# Patient Record
Sex: Female | Born: 1990 | Race: Asian | Hispanic: No | Marital: Single | Smoking: Never smoker
Health system: Southern US, Community
[De-identification: ages and names within clinical notes are randomized; demographics above are authoritative.]

---

## 2015-02-28 ENCOUNTER — Emergency Department (INDEPENDENT_AMBULATORY_CARE_PROVIDER_SITE_OTHER)
Admission: EM | Admit: 2015-02-28 | Discharge: 2015-02-28 | Disposition: A | Discharge status: Home / Self Care | Payer: BLUE CROSS/BLUE SHIELD | Source: Home / Self Care | Attending: Emergency Medicine | Admitting: Emergency Medicine

## 2015-02-28 ENCOUNTER — Encounter (HOSPITAL_COMMUNITY): Payer: Self-pay | Admitting: Emergency Medicine

## 2015-02-28 DIAGNOSIS — Z711 Person with feared health complaint in whom no diagnosis is made: Secondary | ICD-10-CM | POA: Diagnosis not present

## 2015-02-28 NOTE — ED Notes (Signed)
Pt states that she believes she left her tampon in her vagina from last night.

## 2015-02-28 NOTE — ED Provider Notes (Signed)
CSN: [id number]     Arrival date & time 02/28/15  1554 History   First MD Initiated Contact with Patient 02/28/15 1737     No chief complaint on file.  (Consider location/radiation/quality/duration/timing/severity/associated sxs/prior Treatment) HPI She is a 24 year old woman here for vaginal concern. She states she woke up this morning and didn't see the strings for her tampon. She put in another camp on and when she removed it later today there is not as much blood as she was expecting. She is concerned that there is a tampon in her vagina. She denies any pain or discomfort.  No past medical history on file. No past surgical history on file. No family history on file. History  Substance Use Topics  . Smoking status: Not on file  . Smokeless tobacco: Not on file  . Alcohol Use: Not on file   OB History    No data available     Review of Systems As in history of present illness Allergies  Review of patient's allergies indicates not on file.  Home Medications   Prior to Admission medications   Not on File   BP 102/60 mmHg  Pulse 60  Temp(Src) 98.4 F (36.9 C) (Oral)  Resp 16  SpO2 100% Physical Exam  Constitutional: She is oriented to person, place, and time. She appears well-developed and well-nourished. No distress.  Cardiovascular: Normal rate.   Pulmonary/Chest: Effort normal.  Genitourinary: There is no rash on the right labia. There is no rash on the left labia. There is bleeding (on period) in the vagina. No foreign body around the vagina. No signs of injury around the vagina. No vaginal discharge found.  Neurological: She is alert and oriented to person, place, and time.    ED Course  Procedures (including critical care time) Labs Review Labs Reviewed - No data to display  Imaging Review No results found.   MDM   1. Worried well    No foreign body in the vagina. Follow-up as needed.    Melony Overly, MD 02/28/15 [phone]

## 2015-02-28 NOTE — Discharge Instructions (Signed)
There was no foreign body in your vagina.

## 2019-07-27 ENCOUNTER — Other Ambulatory Visit: Payer: Self-pay

## 2019-07-27 DIAGNOSIS — Z20822 Contact with and (suspected) exposure to covid-19: Secondary | ICD-10-CM

## 2019-07-28 LAB — NOVEL CORONAVIRUS, NAA: SARS-CoV-2, NAA: NOT DETECTED

## 2019-12-14 ENCOUNTER — Other Ambulatory Visit: Payer: Self-pay

## 2021-02-11 ENCOUNTER — Emergency Department (HOSPITAL_COMMUNITY)
Admission: EM | Admit: 2021-02-11 | Discharge: 2021-02-11 | Disposition: A | Discharge status: Home / Self Care | Payer: BC Managed Care – PPO | Attending: Emergency Medicine | Admitting: Emergency Medicine

## 2021-02-11 ENCOUNTER — Emergency Department (HOSPITAL_COMMUNITY): Payer: BC Managed Care – PPO

## 2021-02-11 ENCOUNTER — Encounter (HOSPITAL_COMMUNITY): Payer: Self-pay

## 2021-02-11 ENCOUNTER — Other Ambulatory Visit: Payer: Self-pay

## 2021-02-11 DIAGNOSIS — D259 Leiomyoma of uterus, unspecified: Secondary | ICD-10-CM | POA: Insufficient documentation

## 2021-02-11 DIAGNOSIS — D7389 Other diseases of spleen: Secondary | ICD-10-CM | POA: Insufficient documentation

## 2021-02-11 DIAGNOSIS — R1031 Right lower quadrant pain: Secondary | ICD-10-CM

## 2021-02-11 DIAGNOSIS — R1011 Right upper quadrant pain: Secondary | ICD-10-CM | POA: Diagnosis present

## 2021-02-11 LAB — URINALYSIS, ROUTINE W REFLEX MICROSCOPIC
Bilirubin Urine: NEGATIVE
Glucose, UA: NEGATIVE mg/dL
Ketones, ur: NEGATIVE mg/dL
Nitrite: NEGATIVE
Protein, ur: NEGATIVE mg/dL
Specific Gravity, Urine: 1.013 (ref 1.005–1.030)
pH: 5 (ref 5.0–8.0)

## 2021-02-11 LAB — CBC WITH DIFFERENTIAL/PLATELET
Abs Immature Granulocytes: 0.05 10*3/uL (ref 0.00–0.07)
Basophils Absolute: 0 10*3/uL (ref 0.0–0.1)
Basophils Relative: 0 %
Eosinophils Absolute: 0.2 10*3/uL (ref 0.0–0.5)
Eosinophils Relative: 2 %
HCT: 42.4 % (ref 36.0–46.0)
Hemoglobin: 13.4 g/dL (ref 12.0–15.0)
Immature Granulocytes: 1 %
Lymphocytes Relative: 25 %
Lymphs Abs: 2.5 10*3/uL (ref 0.7–4.0)
MCH: 27.6 pg (ref 26.0–34.0)
MCHC: 31.6 g/dL (ref 30.0–36.0)
MCV: 87.4 fL (ref 80.0–100.0)
Monocytes Absolute: 0.6 10*3/uL (ref 0.1–1.0)
Monocytes Relative: 6 %
Neutro Abs: 6.7 10*3/uL (ref 1.7–7.7)
Neutrophils Relative %: 66 %
Platelets: 268 10*3/uL (ref 150–400)
RBC: 4.85 MIL/uL (ref 3.87–5.11)
RDW: 13.7 % (ref 11.5–15.5)
WBC: 10.1 10*3/uL (ref 4.0–10.5)
nRBC: 0 % (ref 0.0–0.2)

## 2021-02-11 LAB — COMPREHENSIVE METABOLIC PANEL
ALT: 29 U/L (ref 0–44)
AST: 21 U/L (ref 15–41)
Albumin: 3.6 g/dL (ref 3.5–5.0)
Alkaline Phosphatase: 59 U/L (ref 38–126)
Anion gap: 7 (ref 5–15)
BUN: 7 mg/dL (ref 6–20)
CO2: 24 mmol/L (ref 22–32)
Calcium: 9 mg/dL (ref 8.9–10.3)
Chloride: 107 mmol/L (ref 98–111)
Creatinine, Ser: 0.63 mg/dL (ref 0.44–1.00)
GFR, Estimated: >60 mL/min (ref >60)
Glucose, Bld: 97 mg/dL (ref 70–99)
Potassium: 4.3 mmol/L (ref 3.5–5.1)
Sodium: 138 mmol/L (ref 135–145)
Total Bilirubin: 0.5 mg/dL (ref 0.3–1.2)
Total Protein: 7.4 g/dL (ref 6.5–8.1)

## 2021-02-11 LAB — PREGNANCY, URINE: Preg Test, Ur: NEGATIVE

## 2021-02-11 MED ORDER — IOHEXOL 300 MG/ML  SOLN
100.0000 mL | Freq: Once | INTRAMUSCULAR | Status: AC | PRN
  Administered 2021-02-11: 100 mL via INTRAVENOUS

## 2021-02-11 NOTE — ED Triage Notes (Signed)
Pt presents with c/o nausea and abdominal pain. Pt reports that she went to UC and was told to come to the ER for an elevated WBC.

## 2021-02-11 NOTE — ED Triage Notes (Signed)
Emergency Medicine Provider Triage Evaluation Note  Lisa Reilly , a 30 y.o. female  was evaluated in triage.  Pt complains of right lower quadrant pain as well as nausea.  Patient states her symptoms started about 6 days ago.  They have been intermittent.  She states that she was evaluated at [facility] walk-in clinic and had a negative pregnancy test but notes that she had an elevated white blood cell count at that time and was told to come to the emergency department.  Denies any vomiting, diarrhea, dysuria, chest pain, shortness of breath, vaginal discharge.  She is married and in a monogamous relationship.  Her last menstrual cycle was on February 25 and was normal.  Review of Systems  Positive: Right lower quadrant pain, nausea Negative: Vomiting, vaginal discharge, dysuria, hematuria, chest pain, shortness of breath  Physical Exam  BP (!) 143/89 (BP Location: Right Arm)   Pulse 68   Temp 98.5 F (36.9 C) (Oral)   Resp 18   LMP 12/30/2020 (Approximate)   SpO2 100%  Gen:   Awake, no distress   HEENT:  Atraumatic  Resp:  Normal effort  Cardiac:  Normal rate  Abd:   Nondistended, mild tenderness noted in the right lower quadrant. MSK:   Moves extremities without difficulty  Neuro:  Speech clear   Medical Decision Making  Medically screening exam initiated at 10:24 AM.  Appropriate orders placed.  Lisa Reilly was informed that the remainder of the evaluation will be completed by another provider, this initial triage assessment does not replace that evaluation, and the importance of remaining in the ED until their evaluation is complete.    Lisa Sexton, PA-C 02/11/21 1025

## 2021-02-11 NOTE — ED Provider Notes (Signed)
[place] DEPT Provider Note   CSN: [id number] Arrival date & time: 02/11/21  1000     History Chief Complaint  Patient presents with  . Nausea  . Abdominal Pain    Lisa Reilly is a 30 y.o. female presents to the ED for evaluation of abdominal pain.  This began 1 week ago.  Reports "mild" pain in the right lower quadrant.  Nonradiating.  It is worse when she walks sometimes.  States it has not been bad enough to take any medicines.  She went to [facility] clinic yesterday and was told that her WBC was high at 13.7.  She was called this morning and told to come to the ER.  Currently has no pain.  She feels achy and tired all over.  She is sexually active with her husband and is not concerned about any STDs.  No history of abdominal surgeries.  No history of kidney stones.  Denies fevers, chills, vomiting.  No dysuria, hematuria.  Reports frequent urination that she attributes to drinking a lot of water during the day.  No abnormal vaginal discharge.  No known ovarian cysts, fibroids.  Her last cycle was Feb 25-March 3rd.   HPI     History reviewed. No pertinent past medical history.  There are no problems to display for this patient.   History reviewed. No pertinent surgical history.   OB History   No obstetric history on file.     History reviewed. No pertinent family history.  Social History   Tobacco Use  . Smoking status: Never Smoker  Substance Use Topics  . Alcohol use: No  . Drug use: No    Home Medications Prior to Admission medications   Not on File    Allergies    Patient has no known allergies.  Review of Systems   Review of Systems  Gastrointestinal: Positive for abdominal pain and nausea.  All other systems reviewed and are negative.   Physical Exam Updated Vital Signs BP 103/66   Pulse (!) 59   Temp 98.5 F (36.9 C) (Oral)   Resp 18   LMP 12/30/2020 (Approximate)   SpO2 98%   Physical Exam Vitals and  nursing note reviewed.  Constitutional:      Appearance: She is well-developed.     Comments: Non toxic in NAD  HENT:     Head: Normocephalic and atraumatic.     Nose: Nose normal.  Eyes:     Conjunctiva/sclera: Conjunctivae normal.  Cardiovascular:     Rate and Rhythm: Normal rate and regular rhythm.  Pulmonary:     Effort: Pulmonary effort is normal.     Breath sounds: Normal breath sounds.  Abdominal:     General: Bowel sounds are normal.     Palpations: Abdomen is soft.     Tenderness: There is abdominal tenderness in the right lower quadrant.     Comments: No G/R/R. No suprapubic or CVA tenderness. Negative Murphy's. Active BS to lower quadrants.   Musculoskeletal:        General: Normal range of motion.     Cervical back: Normal range of motion.  Skin:    General: Skin is warm and dry.     Capillary Refill: Capillary refill takes less than 2 seconds.  Neurological:     Mental Status: She is alert.  Psychiatric:        Behavior: Behavior normal.     ED Results / Procedures / Treatments   Labs (  all labs ordered are listed, but only abnormal results are displayed) Labs Reviewed  URINALYSIS, ROUTINE W REFLEX MICROSCOPIC - Abnormal; Notable for the following components:      Result Value   APPearance HAZY (*)    Hgb urine dipstick MODERATE (*)    Leukocytes,Ua SMALL (*)    Bacteria, UA RARE (*)    All other components within normal limits  URINE CULTURE  COMPREHENSIVE METABOLIC PANEL  CBC WITH DIFFERENTIAL/PLATELET  PREGNANCY, URINE    EKG None  Radiology CT ABDOMEN PELVIS W CONTRAST  Result Date: 02/11/2021 CLINICAL DATA:  Right lower quadrant abdominal pain. Nausea and vomiting. EXAM: CT ABDOMEN AND PELVIS WITH CONTRAST TECHNIQUE: Multidetector CT imaging of the abdomen and pelvis was performed using the standard protocol following bolus administration of intravenous contrast. CONTRAST:  154mL OMNIPAQUE IOHEXOL 300 MG/ML  SOLN COMPARISON:  None. FINDINGS:  Lower chest: Unremarkable. Hepatobiliary: No suspicious focal abnormality within the liver parenchyma. There is no evidence for gallstones, gallbladder wall thickening, or pericholecystic fluid. No intrahepatic or extrahepatic biliary dilation. Pancreas: No focal mass lesion. No dilatation of the main duct. No intraparenchymal cyst. No peripancreatic edema. Spleen: 5.8 x 4.6 cm splenic lesion cannot be definitively characterized. Adrenals/Urinary Tract: No adrenal nodule or mass. Early contrast excretion noted in calices of both kidneys. Kidneys otherwise unremarkable. No evidence for hydroureter. The urinary bladder appears normal for the degree of distention. Stomach/Bowel: Stomach is unremarkable. No gastric wall thickening. No evidence of outlet obstruction. Duodenum is normally positioned as is the ligament of Treitz. No small bowel wall thickening. No small bowel dilatation. The terminal ileum is normal. The appendix is normal. No gross colonic mass. No colonic wall thickening. Vascular/Lymphatic: No abdominal aortic aneurysm. There is no gastrohepatic or hepatoduodenal ligament lymphadenopathy. No retroperitoneal or mesenteric lymphadenopathy. No pelvic sidewall lymphadenopathy. Reproductive: Small uterine fibroids evident. There is no adnexal mass. Other: No intraperitoneal free fluid. Musculoskeletal: No worrisome lytic or sclerotic osseous abnormality. IMPRESSION: 1. No acute findings in the abdomen or pelvis. Specifically, no findings to explain the patient's history of right lower quadrant pain with nausea and vomiting. Terminal ileum and appendix unremarkable. No adnexal mass. 2. 5.8 x 4.6 cm splenic lesion cannot be definitively characterized. Given patient age, this is most likely benign and differential considerations would include hemangioma and hamartoma. MRI of the abdomen without and with might prove helpful to further evaluate. 3. Small uterine fibroids. Electronically Signed   By: Misty Stanley  M.D.   On: 02/11/2021 14:37    Procedures Procedures   Medications Ordered in ED Medications  iohexol (OMNIPAQUE) 300 MG/ML solution 100 mL (100 mLs Intravenous Contrast Given 02/11/21 1355)    ED Course  I have reviewed the triage vital signs and the nursing notes.  Pertinent labs & imaging results that were available during my care of the patient were reviewed by me and considered in my medical decision making (see chart for details).    MDM Rules/Calculators/A&P                          30 year old female presents to the ED for very mild right lower quadrant abdominal pain with nausea.  Seen by [facility] walk-in clinic yesterday and advised to come to the ER.  Was told her WBC was 13 yesterday.  On exam she has very minimal right lower quadrant tenderness.  No peritonitis.  No CVA tenderness.  Denies vaginal or urinary symptoms.  EMR, triage nurse  notes reviewed  DDx-MSK, right-sided diverticulitis, appendicitis, ureteral stone.  Considered pelvic/GYN etiology however patient has no vaginal symptoms, vaginal discharge or bleeding, history of ovarian cyst, fibroids.  She describes her pain is very mild.  Labs, imaging ordered by me as above.  Labs and imaging personally visualized and interpreted  Labs reveal-WBC is 10.1 today.  Remaining labs are unremarkable.  Pregnancy is negative.  Urinalysis is a poor sample with several squamous epithelial, rare bacteria, 6-10 WBC, small leukocytes but no nitrites, RBCs.  Offered patient pelvic exam for thoroughness however she has no vaginal symptoms or urinary symptoms.  She would prefer to defer this at this time and I think this is reasonable.  We will proceed with CT A/P.  1848: CT A/P shows incidental finding of splenic lesion, small uterine fibroid but otherwise unremarkable.  At this time feel patient is appropriate for discharge.  Etiology of pain is unclear at this time, could be MSK etiology.  I recommended scheduled ibuprofen every 8  hours for the next 2 days for pain, monitoring of her symptoms.  Return precautions discussed.  Patient is comfortable with this plan.  She is aware of incidental findings on the CT scan that she can follow-up with with PCP.  Final Clinical Impression(s) / ED Diagnoses Final diagnoses:  RLQ abdominal pain  Lesion of spleen  Uterine leiomyoma, unspecified location    Rx / DC Orders ED Discharge Orders    None       Arlean Hopping 02/11/21 1530    Davonna Belling, MD 02/12/21 [phone]

## 2021-02-11 NOTE — Discharge Instructions (Addendum)
You were seen in the emergency department for right lower abdominal pain, nausea  Lab work was normal.  Urinalysis was not a very good sample but there was no blood or obvious infection.  We will send this for culture to exclude UTI but you did not have any symptoms of this.  If your urine grows any bacteria you will be contacted and prescription for antibiotics will be sent to your pharmacy.  CT scan showed incidental finding of a small lesion on your spleen, this is likely benign.  It also noticed a very small fibroid in your uterus.  These 2 things are unlikely to be causing your pain.  You may discuss these findings with your primary care doctor.  Take 600 mg of ibuprofen every 8 hours for the next 48 hours to help with your pain.  You can then take it as needed.  Follow-up with your primary care doctor in 1 week if your symptoms have not improved.  Return to the ED for worsening, more severe symptoms, fever, vomiting, urinary symptoms, abnormal vaginal discharge or bleeding

## 2021-02-12 LAB — URINE CULTURE: Culture: NO GROWTH

## 2022-09-05 DIAGNOSIS — E039 Hypothyroidism, unspecified: Secondary | ICD-10-CM | POA: Insufficient documentation

## 2022-11-28 IMAGING — CT CT ABD-PELV W/ CM
2 of 5 series · 16 of 46 positions shown, 18 images · IV contrast (omnipaque)
Comparison: None.

CLINICAL DATA: Right lower quadrant abdominal pain. Nausea and
vomiting.

EXAM:
CT ABDOMEN AND PELVIS WITH CONTRAST
TECHNIQUE: Multidetector CT imaging of the abdomen and pelvis was performed
using the standard protocol following bolus administration of
intravenous contrast.
CONTRAST:  100mL OMNIPAQUE IOHEXOL 300 MG/ML  SOLN

[Series 2: axial st · axial · 0.73mm/px · z∈[+1246,+1681]mm · 13 of 101 slices shown, 15 images]
[im 7/101  soft-tissue]
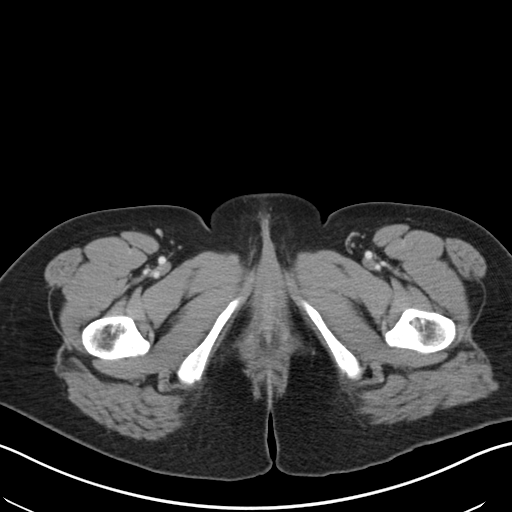
[im 7/101  bone]
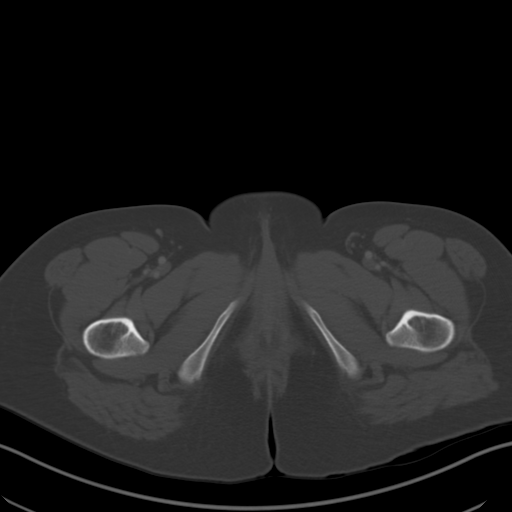
[im 14/101  soft-tissue]
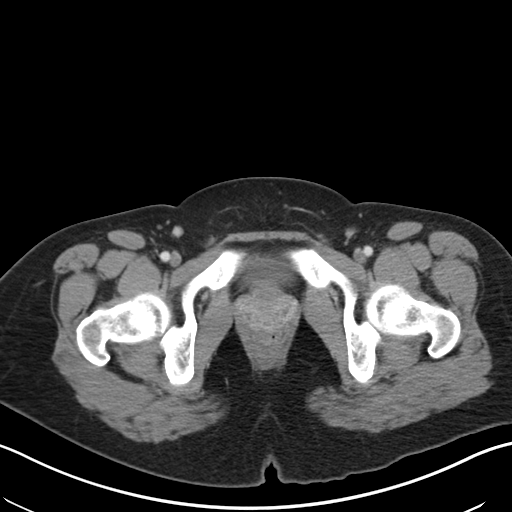
[im 21/101  soft-tissue]
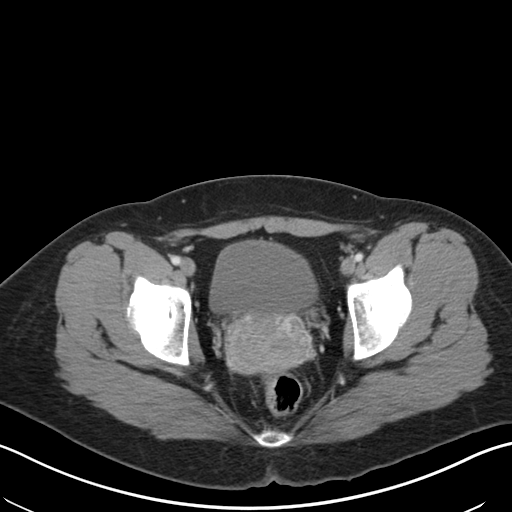
[im 27/101  soft-tissue]
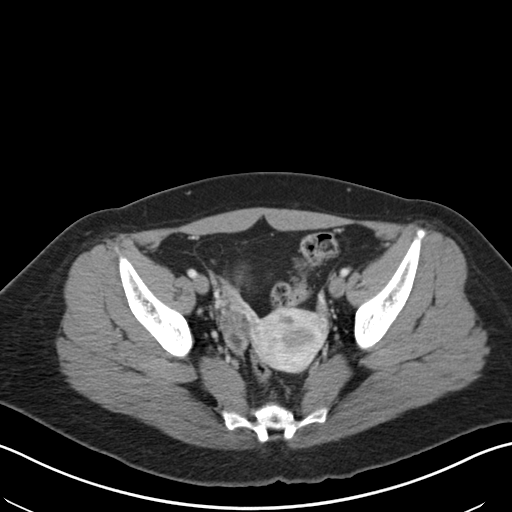
[im 34/101  soft-tissue]
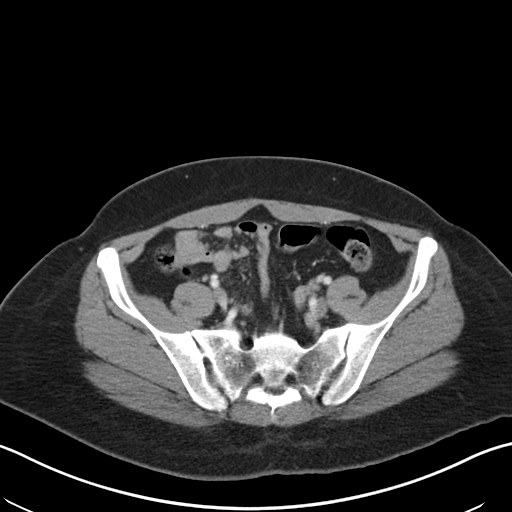
[im 41/101  soft-tissue]
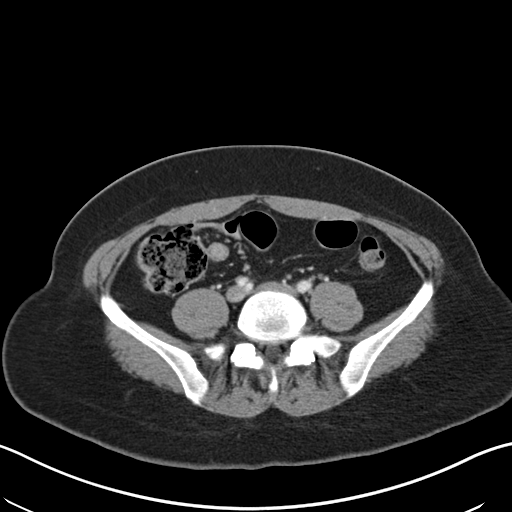
[im 54/101  soft-tissue]
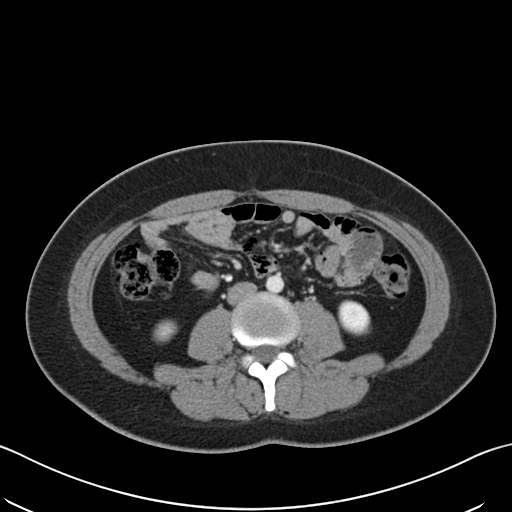
[im 61/101  soft-tissue]
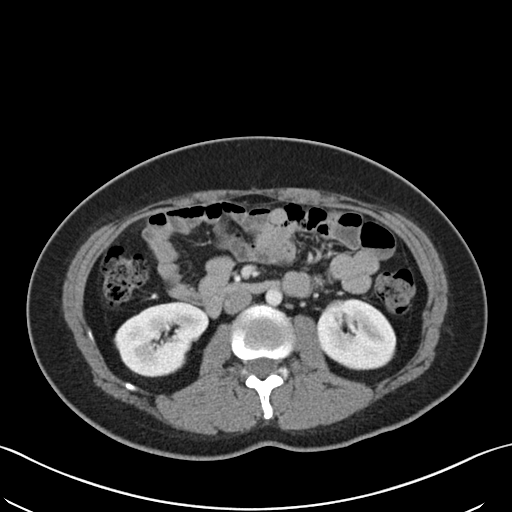
[im 67/101  soft-tissue]
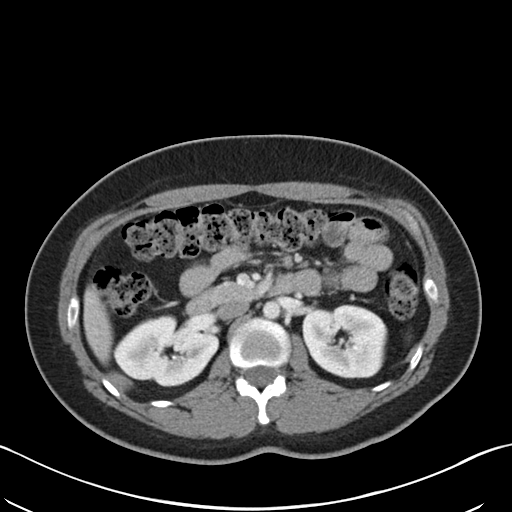
[im 67/101  bone]
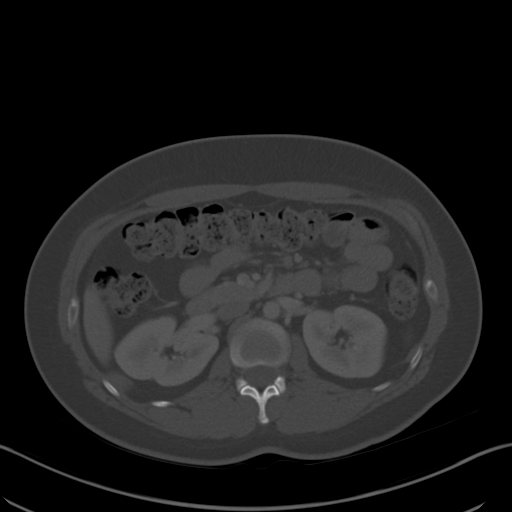
[im 74/101  soft-tissue]
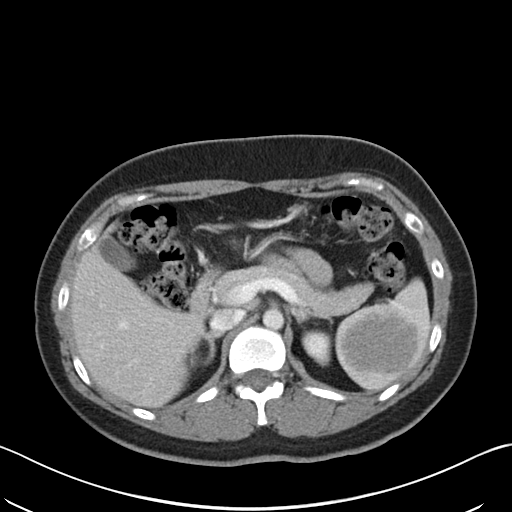
[im 81/101  soft-tissue]
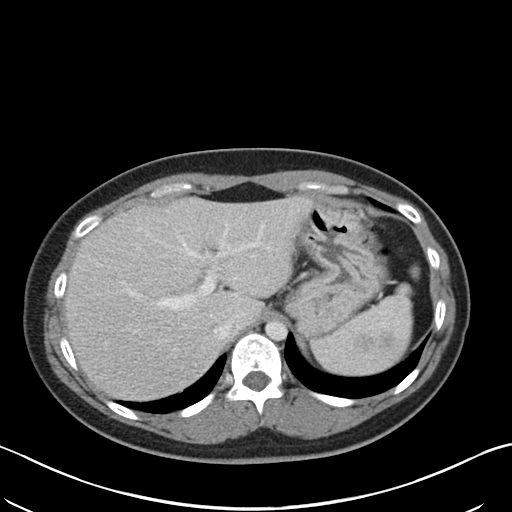
[im 87/101  soft-tissue]
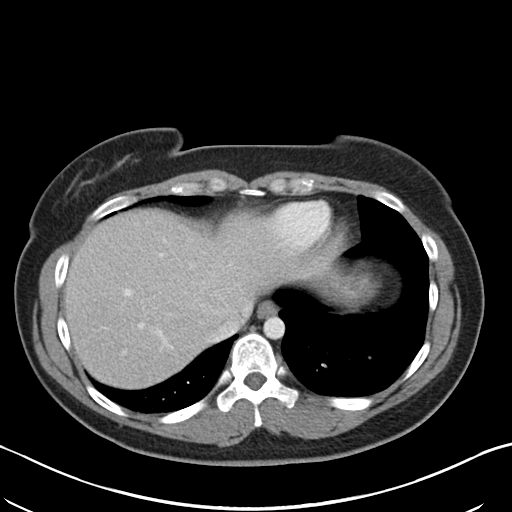
[im 94/101  soft-tissue]
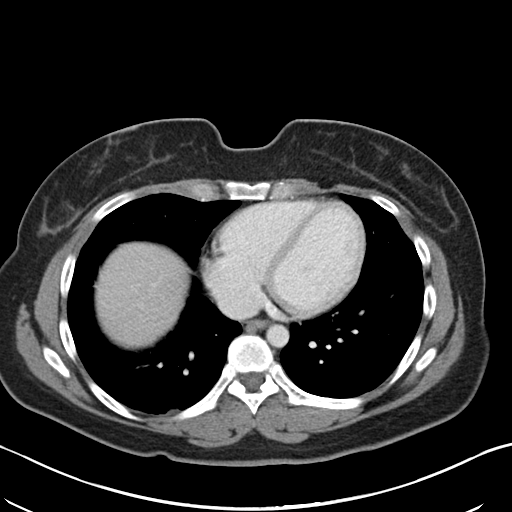

[Series 6: coronal st · coronal · 0.96mm/px · 3 of 127 slices shown]
[im 43/127  soft-tissue]
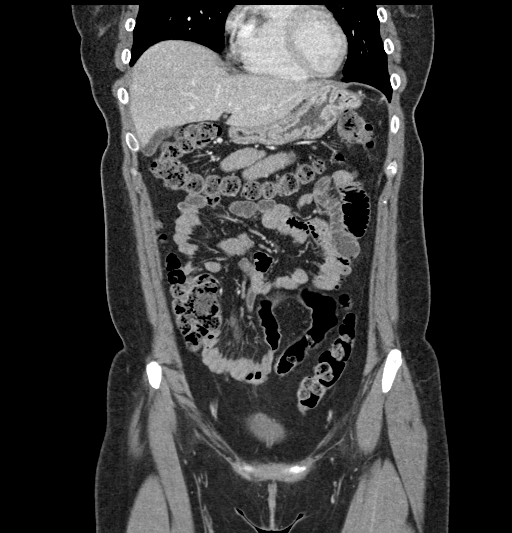
[im 57/127  soft-tissue]
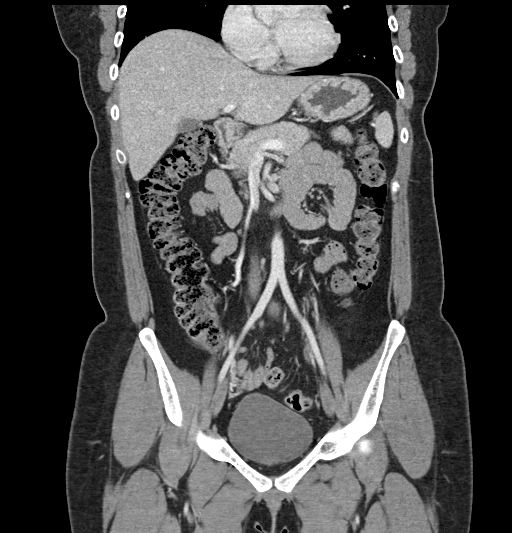
[im 71/127  soft-tissue]
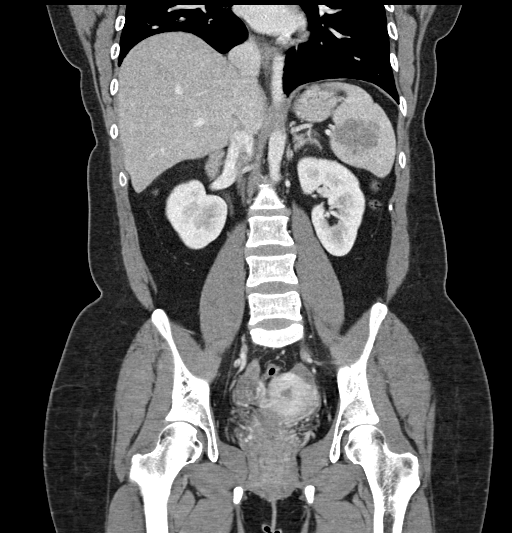

[16 of 46 positions shown; findings below may reference images not displayed]

FINDINGS: Lower chest: Unremarkable.

Hepatobiliary: No suspicious focal abnormality within the liver
parenchyma. There is no evidence for gallstones, gallbladder wall
thickening, or pericholecystic fluid. No intrahepatic or
extrahepatic biliary dilation.

Pancreas: No focal mass lesion. No dilatation of the main duct. No
intraparenchymal cyst. No peripancreatic edema.

Spleen: 5.8 x 4.6 cm splenic lesion cannot be definitively
characterized.

Adrenals/Urinary Tract: No adrenal nodule or mass. Early contrast
excretion noted in calices of both kidneys. Kidneys otherwise
unremarkable. No evidence for hydroureter. The urinary bladder
appears normal for the degree of distention.

Stomach/Bowel: Stomach is unremarkable. No gastric wall thickening.
No evidence of outlet obstruction. Duodenum is normally positioned
as is the ligament of Treitz. No small bowel wall thickening. No
small bowel dilatation. The terminal ileum is normal. The appendix
is normal. No gross colonic mass. No colonic wall thickening.

Vascular/Lymphatic: No abdominal aortic aneurysm. There is no
gastrohepatic or hepatoduodenal ligament lymphadenopathy. No
retroperitoneal or mesenteric lymphadenopathy. No pelvic sidewall
lymphadenopathy.

Reproductive: Small uterine fibroids evident. There is no adnexal
mass.

Other: No intraperitoneal free fluid.

Musculoskeletal: No worrisome lytic or sclerotic osseous
abnormality.
IMPRESSION: 1. No acute findings in the abdomen or pelvis. Specifically, no
findings to explain the patient's history of right lower quadrant
pain with nausea and vomiting. Terminal ileum and appendix
unremarkable. No adnexal mass.
2. 5.8 x 4.6 cm splenic lesion cannot be definitively characterized.
Given patient age, this is most likely benign and differential
considerations would include hemangioma and hamartoma. MRI of the
abdomen without and with might prove helpful to further evaluate.
3. Small uterine fibroids.

## 2023-02-07 ENCOUNTER — Ambulatory Visit: Payer: 59 | Admitting: Podiatry

## 2023-02-07 DIAGNOSIS — B070 Plantar wart: Secondary | ICD-10-CM

## 2023-02-07 NOTE — Progress Notes (Signed)
Subjective:   Patient ID: Lisa Reilly, female   DOB: 32 y.o.   MRN: [id number]   HPI Chief Complaint  Patient presents with   Callouses    Left foot pt stated that she had a corn and she was using these corn pads and then she scraped it off and now it is tender when she walks    32 year old female presents the office for above concerns.  She states that she had a corn or skin lesion on the bottom of her foot and she used pads and then scraped it and now it is tender when she walks and the lesion is still present.  No drainage or pus.  Lesions.   Review of Systems  All other systems reviewed and are negative.  No past medical history on file.  No past surgical history on file.   Current Outpatient Medications:    levothyroxine (SYNTHROID) 25 MCG tablet, Take 25 mcg by mouth every morning., Disp: , Rfl:    metFORMIN (GLUCOPHAGE-XR) 500 MG 24 hr tablet, Take 1,000 mg by mouth daily., Disp: , Rfl:    nitrofurantoin, macrocrystal-monohydrate, (MACROBID) 100 MG capsule, Take 100 mg by mouth every 12 (twelve) hours., Disp: , Rfl:    phenazopyridine (PYRIDIUM) 200 MG tablet, Take 200 mg by mouth 3 (three) times daily as needed., Disp: , Rfl:    Thyroid (LEVOTHYROXINE-LIOTHYRONINE PO), , Disp: , Rfl:   No Known Allergies        Objective:  Physical Exam  General: AAO x3, NAD  Dermatological: On the plantar aspect left foot submetatarsal 2 is a hyperkeratotic lesion there is small pinpoint bleeding with evidence of verruca.  There are some macerated tissue on the periphery.  There is no drainage or pus.  Upon debridement there is no underlying ulceration or signs of infection.  No puncture wound.  Vascular: Dorsalis Pedis artery and Posterior Tibial artery pedal pulses are 2/4 bilateral with immedate capillary fill time. There is no pain with calf compression, swelling, warmth, erythema.   Neruologic: Grossly intact via light touch bilateral.   Musculoskeletal: Tenderness with  skin lesion.  No areas of discomfort. Gait: Unassisted, Nonantalgic.       Assessment:   Hyperkeratotic lesion, likely verruca left foot     Plan:  -Treatment options discussed including all alternatives, risks, and complications -Etiology of symptoms were discussed -Sharply debrided lesion today without any complications.  Cleaned skin with alcohol.  Cantharone Plus was applied followed by occlusive bandage.  Postprocedure instructions discussed.  Tolerated well. -Offloading pads  -Monitor for any clinical signs or symptoms of infection and directed to call the office immediately should any occur or go to the ER.  No follow-ups on file.  Vivi BarrackMatthew R Synia Douglass DPM

## 2023-02-08 ENCOUNTER — Encounter: Payer: Self-pay | Admitting: Podiatry

## 2023-02-09 ENCOUNTER — Encounter: Payer: Self-pay | Admitting: Podiatry
